# Patient Record
Sex: Male | Born: 1965 | Race: White | Hispanic: No | Marital: Married | Smoking: Current every day smoker
Health system: Southern US, Community
[De-identification: ages and names within clinical notes are randomized; demographics above are authoritative.]

## PROBLEM LIST (undated history)

## (undated) DIAGNOSIS — I10 Essential (primary) hypertension: Secondary | ICD-10-CM

## (undated) HISTORY — PX: CHOLECYSTECTOMY: SHX55

## (undated) HISTORY — PX: HERNIA REPAIR: SHX51

## (undated) HISTORY — PX: SURGERY SCROTAL / TESTICULAR: SUR1316

---

## 2014-03-22 ENCOUNTER — Emergency Department (HOSPITAL_COMMUNITY)
Admission: EM | Admit: 2014-03-22 | Discharge: 2014-03-22 | Disposition: A | Discharge status: Home / Self Care | Payer: Managed Care, Other (non HMO) | Attending: Emergency Medicine | Admitting: Emergency Medicine

## 2014-03-22 ENCOUNTER — Emergency Department (HOSPITAL_COMMUNITY): Payer: Managed Care, Other (non HMO)

## 2014-03-22 ENCOUNTER — Encounter (HOSPITAL_COMMUNITY): Payer: Self-pay

## 2014-03-22 DIAGNOSIS — R059 Cough, unspecified: Secondary | ICD-10-CM

## 2014-03-22 DIAGNOSIS — R05 Cough: Secondary | ICD-10-CM

## 2014-03-22 DIAGNOSIS — R42 Dizziness and giddiness: Secondary | ICD-10-CM | POA: Diagnosis not present

## 2014-03-22 DIAGNOSIS — J209 Acute bronchitis, unspecified: Secondary | ICD-10-CM | POA: Diagnosis not present

## 2014-03-22 DIAGNOSIS — R630 Anorexia: Secondary | ICD-10-CM | POA: Diagnosis not present

## 2014-03-22 DIAGNOSIS — Z880 Allergy status to penicillin: Secondary | ICD-10-CM | POA: Diagnosis not present

## 2014-03-22 DIAGNOSIS — R55 Syncope and collapse: Secondary | ICD-10-CM | POA: Diagnosis not present

## 2014-03-22 DIAGNOSIS — I10 Essential (primary) hypertension: Secondary | ICD-10-CM | POA: Diagnosis not present

## 2014-03-22 DIAGNOSIS — R51 Headache: Secondary | ICD-10-CM | POA: Insufficient documentation

## 2014-03-22 DIAGNOSIS — Z720 Tobacco use: Secondary | ICD-10-CM | POA: Insufficient documentation

## 2014-03-22 DIAGNOSIS — J40 Bronchitis, not specified as acute or chronic: Secondary | ICD-10-CM

## 2014-03-22 HISTORY — DX: Essential (primary) hypertension: I10

## 2014-03-22 LAB — I-STAT TROPONIN, ED: Troponin i, poc: 0 ng/mL (ref 0.00–0.08)

## 2014-03-22 LAB — CBC WITH DIFFERENTIAL/PLATELET
BASOS ABS: 0 10*3/uL (ref 0.0–0.1)
Basophils Relative: 0 % (ref 0–1)
EOS ABS: 0 10*3/uL (ref 0.0–0.7)
Eosinophils Relative: 0 % (ref 0–5)
HCT: 43.7 % (ref 39.0–52.0)
HEMOGLOBIN: 15.2 g/dL (ref 13.0–17.0)
LYMPHS ABS: 1 10*3/uL (ref 0.7–4.0)
Lymphocytes Relative: 12 % (ref 12–46)
MCH: 31.1 pg (ref 26.0–34.0)
MCHC: 34.8 g/dL (ref 30.0–36.0)
MCV: 89.5 fL (ref 78.0–100.0)
Monocytes Absolute: 0.7 10*3/uL (ref 0.1–1.0)
Monocytes Relative: 8 % (ref 3–12)
NEUTROS PCT: 80 % — AB (ref 43–77)
Neutro Abs: 6.3 10*3/uL (ref 1.7–7.7)
Platelets: 194 10*3/uL (ref 150–400)
RBC: 4.88 MIL/uL (ref 4.22–5.81)
RDW: 14.1 % (ref 11.5–15.5)
WBC: 7.9 10*3/uL (ref 4.0–10.5)

## 2014-03-22 LAB — BASIC METABOLIC PANEL
ANION GAP: 8 (ref 5–15)
BUN: 11 mg/dL (ref 6–23)
CO2: 26 mmol/L (ref 19–32)
CREATININE: 1.02 mg/dL (ref 0.50–1.35)
Calcium: 8.8 mg/dL (ref 8.4–10.5)
Chloride: 105 mmol/L (ref 96–112)
GFR calc Af Amer: >90 mL/min (ref >90)
GFR calc non Af Amer: 85 mL/min — ABNORMAL LOW (ref >90)
Glucose, Bld: 129 mg/dL — ABNORMAL HIGH (ref 70–99)
Potassium: 4 mmol/L (ref 3.5–5.1)
SODIUM: 139 mmol/L (ref 135–145)

## 2014-03-22 MED ORDER — ALBUTEROL SULFATE (2.5 MG/3ML) 0.083% IN NEBU
5.0000 mg | INHALATION_SOLUTION | Freq: Once | RESPIRATORY_TRACT | Status: AC
  Administered 2014-03-22: 5 mg via RESPIRATORY_TRACT
  Filled 2014-03-22: qty 6

## 2014-03-22 MED ORDER — SODIUM CHLORIDE 0.9 % IV BOLUS (SEPSIS)
1000.0000 mL | Freq: Once | INTRAVENOUS | Status: AC
  Administered 2014-03-22: 1000 mL via INTRAVENOUS

## 2014-03-22 NOTE — Discharge Instructions (Signed)
Continue medications from urgent care. Make sure to rest over the next few days and drink plenty of fluids to stay hydrated. Return to the ED for new or worsening symptoms.

## 2014-03-22 NOTE — ED Notes (Signed)
Pt presents with witnessed syncopal event at work.  Pt reports bending over, becoming dizzy with syncope x 10 seconds.  Pt reports headache from coughing, diagnosed with bronchitis yesterday, began z-pack, steroids and inhaler yesterday; began testosterone replacement x 2 weeks ago.

## 2014-03-22 NOTE — ED Provider Notes (Signed)
CSN: [id number]     Arrival date & time 03/22/14  1326 History   First MD Initiated Contact with Patient 03/22/14 1329     Chief Complaint  Patient presents with  . Loss of Consciousness     (Consider location/radiation/quality/duration/timing/severity/associated sxs/prior Treatment) Patient is a 49 y.o. male presenting with syncope. The history is provided by the patient and medical records.  Loss of Consciousness   This is a 49 year old male with past medical history significant for hypertension, presenting to the ED following a syncopal episode that occurred at work. 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 There was no head injury. He was unresponsive for approximately 10 seconds.  Patient states over the past week he has been having a productive cough, sinus pressure, and nasal congestion. He was seen at urgent care yesterday and diagnosed with bronchitis and started on Z-Pak, steroids, and albuterol inhaler. He has had decreased PO intake because of this.  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  He denies chest pain or shortness of breath. No cardiac history. Patient is a daily smoker.  Patient's only complaint on arrival is headache which he attributes to coughing and sinus pressure.  Orthostatics by EMS were unremarkable.  VSS on arrival.  Past Medical History  Diagnosis Date  . Hypertension    Past Surgical History  Procedure Laterality Date  . Hernia repair    . Surgery scrotal / testicular    . Cholecystectomy     No family history on file. History  Substance Use Topics  . Smoking status: Current Every Day Smoker -- 1.00 packs/day  . Smokeless tobacco: Not on file  . Alcohol Use: Yes    Review of Systems  HENT: Positive for congestion and sinus pressure.    Respiratory: Positive for cough and wheezing.   Cardiovascular: Positive for syncope.  Neurological: Positive for syncope.  All other systems reviewed and are negative.  Allergies  Penicillins  Home Medications   Prior to Admission medications   Not on File   BP 136/74 mmHg  Pulse 77  Temp(Src) 98 F (36.7 C) (Oral)  Resp 17  Ht  (1.803 m)  Wt 285 lb (129.275 kg)  BMI 39.77 kg/m2  SpO2 97%   Physical Exam  Constitutional: He is oriented to person, place, and time. He appears well-developed and well-nourished. No distress.  HENT:  Head: Normocephalic and atraumatic.  Mouth/Throat: Oropharynx is clear and moist.  No visible signs of head trauma  Eyes: Conjunctivae and EOM are normal. Pupils are equal, round, and reactive to light.  Neck: Normal range of motion and full passive range of motion without pain. Neck supple. No rigidity.  No meningismus  Cardiovascular: Normal rate, regular rhythm and normal heart sounds.   Pulmonary/Chest: Effort normal and breath sounds normal. No respiratory distress. He has no wheezes.  Diffuse wheezes, more pronounced at bilateral bases; no rhonchi or rales  Abdominal: Soft. Bowel sounds are normal. There is no tenderness. There is no rebound.  Musculoskeletal: Normal range of motion. He exhibits no edema.  No pitting edema or calf swelling  Neurological: He is alert and oriented to person, place, and time.  AAOx3, answering questions appropriately; equal strength UE and LE bilaterally; CN grossly intact; moves all  extremities appropriately without ataxia; no focal neuro deficits or facial asymmetry appreciated  Skin: Skin is warm and dry. He is not diaphoretic.  Psychiatric: He has a normal mood and affect.  Nursing note and vitals reviewed.   ED Course  Procedures (including critical care time) Labs Review Labs Reviewed  CBC WITH DIFFERENTIAL/PLATELET - Abnormal; Notable for the following:    Neutrophils Relative % 80 (*)     All other components within normal limits  BASIC METABOLIC PANEL - Abnormal; Notable for the following:    Glucose, Bld 129 (*)    GFR calc non Af Amer 85 (*)    All other components within normal limits  Rosezena SensorI-STAT TROPOININ, ED    Imaging Review Dg Chest 2 View  03/22/2014   CLINICAL DATA:  Syncope knee. Cough for 2 days. Shortness of breath for 1 day.  EXAM: CHEST  2 VIEW  COMPARISON:  None.  FINDINGS: There is some peribronchial thickening. No consolidative process, pneumothorax or effusion. Heart size is normal. No focal bony abnormality.  IMPRESSION: Findings compatible with bronchitis.  No focal abnormality.   Electronically Signed   By: Drusilla Kannerhomas  Dalessio M.D.   On: 03/22/2014 15:07     EKG Interpretation   Date/Time:  Thursday March 22 2014 13:32:24 EST Ventricular Rate:  74 PR Interval:  129 QRS Duration: 99 QT Interval:  388 QTC Calculation: 430 R Axis:   48 Text Interpretation:  Sinus rhythm ST elev, probable normal early repol  pattern No previous tracing Confirmed by POLLINA  MD, CHRISTOPHER [phone]([id number])  on 03/22/2014 3:16:01 PM      MDM   Final diagnoses:  Syncope  Cough  Bronchitis   49 year old male with syncope at work while bending over. He was unresponsive for approximately 10 seconds. On arrival to the ED he is alert and oriented without focal neurologic deficit. He complains of a mild headache which he attributes to repetitive coughing and sinus pressure. He is currently being treated for bronchitis with antibiotics, steroids, and albuterol inhaler.  He denies any current chest pain or shortness of breath. No known cardiac history.  EKG sinus rhythm without ischemic change. Will obtain basic labs, troponin, chest x-ray.  IV fluids given.  Labwork reassuring. Troponin negative. Chest x-ray with bronchitis which patient is already being treated for. After fluids he states improvement of his headache. Syncope likely multifactorial due to his current illness, decreased  PO intake, and possibly positional BP changes while bent over.  Low suspicion for cardiac or neurologic etiology of his syncope.  Feel patient safe for discharge home with outpatient follow-up. He will continue medications prescribed from urgent care yesterday for treatment of his bronchitis.  Discussed plan with patient, he/she acknowledged understanding and agreed with plan of care.  Return precautions given for new or worsening symptoms.  Case discussed with attending physician, Dr. Blinda LeatherwoodPollina, who agrees with assessment and plan of care.  Garlon HatchetLisa M Deadra Diggins, PA-C 03/22/14 1542  Gilda Creasehristopher J. Pollina, MD 03/23/14 [phone]

## 2016-05-31 IMAGING — CR DG CHEST 2V
2 series · 2 of 2 positions shown · non-contrast
Comparison: None.

CLINICAL DATA: Syncope knee. Cough for 2 days. Shortness of breath
for 1 day.

EXAM:
CHEST  2 VIEW

[chest pa]
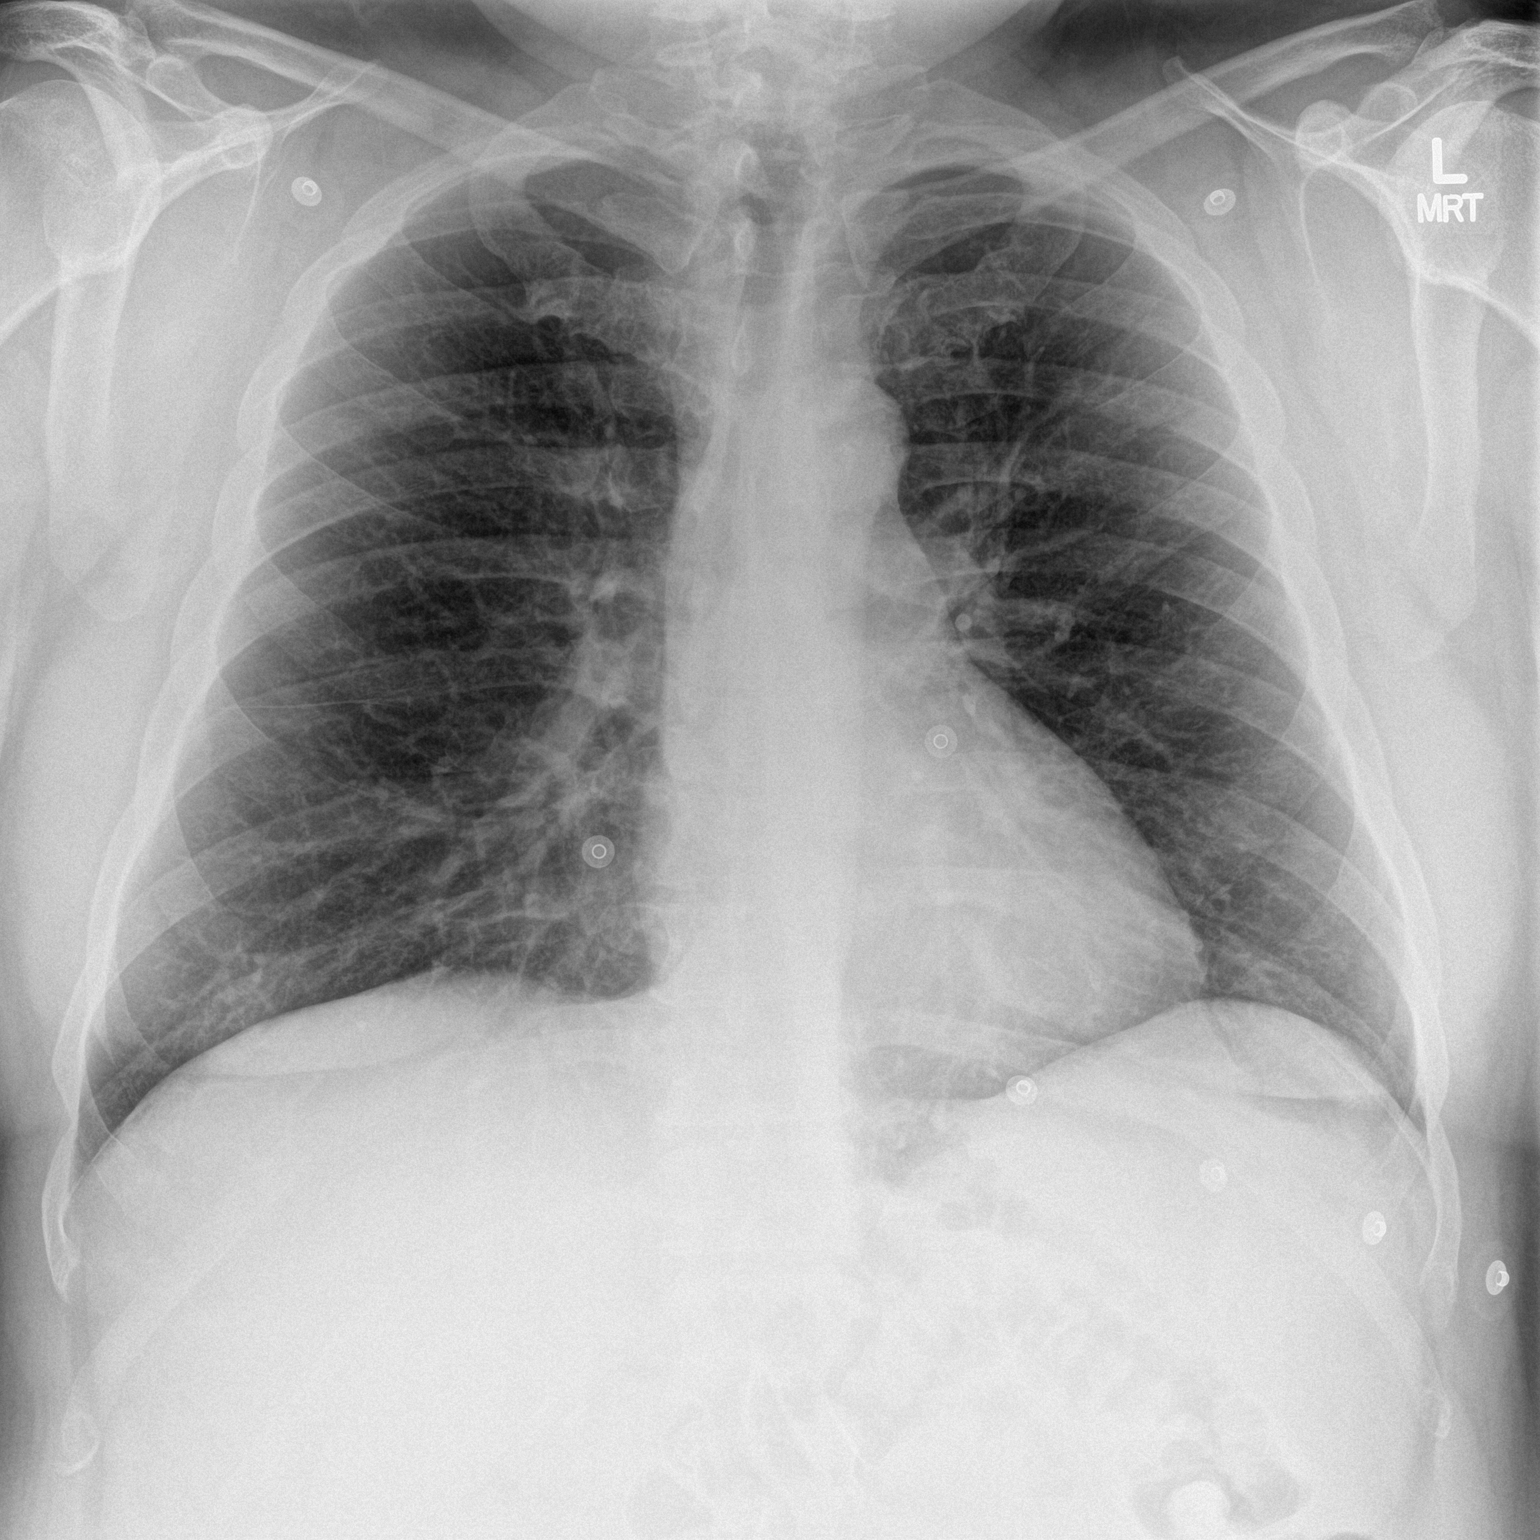

[chest lat]
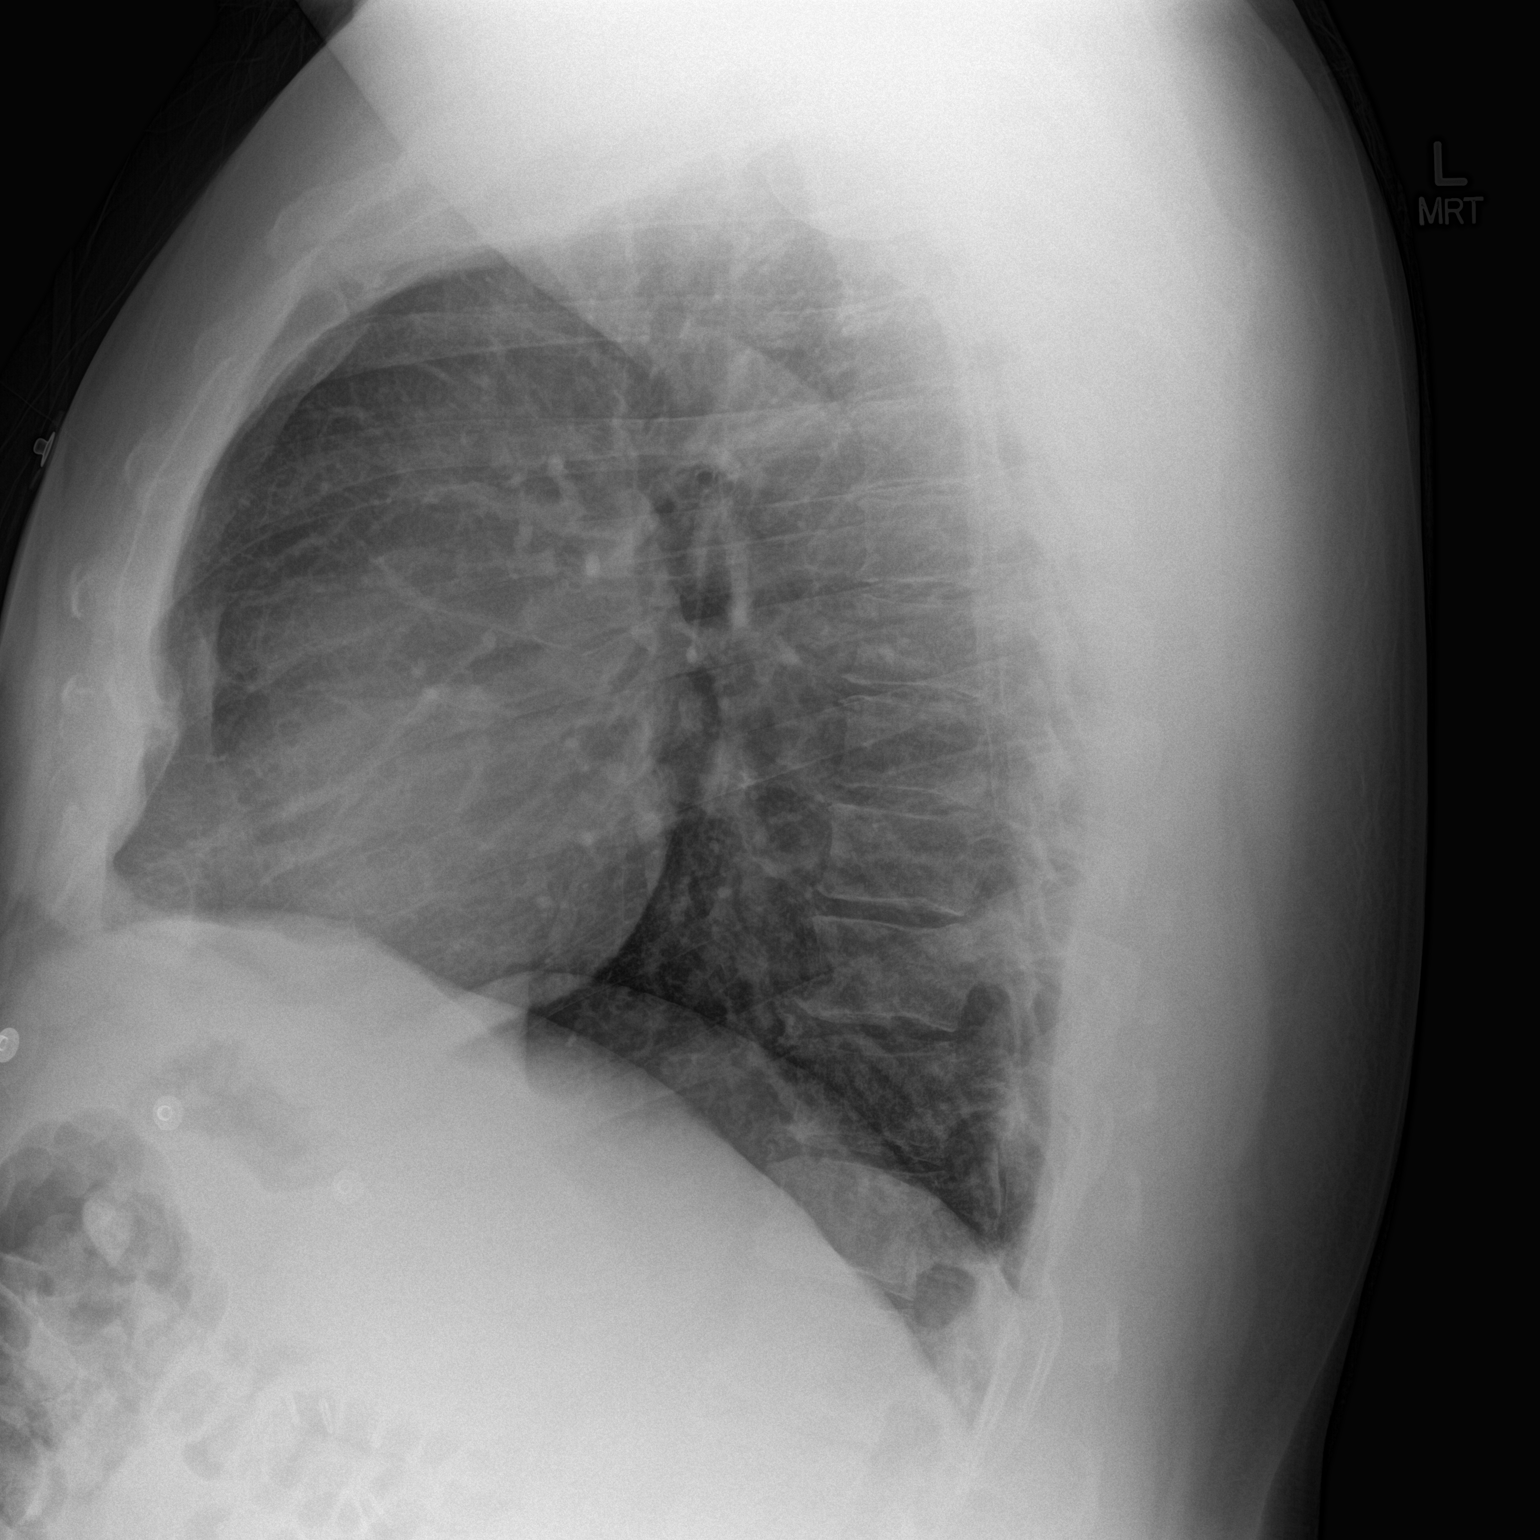

[2 of 2 positions shown; findings below may reference images not displayed]

FINDINGS: There is some peribronchial thickening. No consolidative process,
pneumothorax or effusion. Heart size is normal. No focal bony
abnormality.
IMPRESSION: Findings compatible with bronchitis.  No focal abnormality.
